# Patient Record
Sex: Female | Born: 1990 | Race: Black or African American | Hispanic: No | Marital: Single | Smoking: Never smoker
Health system: Southern US, Community
[De-identification: ages and names within clinical notes are randomized; demographics above are authoritative.]

## PROBLEM LIST (undated history)

## (undated) DIAGNOSIS — T7840XA Allergy, unspecified, initial encounter: Secondary | ICD-10-CM

## (undated) HISTORY — DX: Allergy, unspecified, initial encounter: T78.40XA

---

## 2012-08-21 ENCOUNTER — Ambulatory Visit: Payer: Self-pay | Admitting: Emergency Medicine

## 2012-08-21 VITALS — BP 122/88 | HR 104 | Temp 100.0°F | Resp 16 | Ht 62.38 in | Wt 163.6 lb

## 2012-08-21 DIAGNOSIS — J209 Acute bronchitis, unspecified: Secondary | ICD-10-CM

## 2012-08-21 DIAGNOSIS — R509 Fever, unspecified: Secondary | ICD-10-CM

## 2012-08-21 DIAGNOSIS — R52 Pain, unspecified: Secondary | ICD-10-CM

## 2012-08-21 LAB — POCT INFLUENZA A/B
Influenza A, POC: NEGATIVE
Influenza B, POC: NEGATIVE

## 2012-08-21 MED ORDER — HYDROCOD POLST-CHLORPHEN POLST 10-8 MG/5ML PO LQCR: 5.0000 mL | Freq: Two times a day (BID) | ORAL | Status: AC | PRN

## 2012-08-21 MED ORDER — AZITHROMYCIN 250 MG PO TABS: ORAL_TABLET | ORAL | Status: AC

## 2012-08-21 NOTE — Progress Notes (Signed)
[facility] [address] [place] [address] [phone]  Date:  08/21/2012   Name:  Anne Brooks   DOB:  04/02/1991   MRN:  [id number]  PCP:  No primary provider on file.    Chief Complaint: Generalized Body Aches, Fever, Chills and Cough   History of Present Illness:  Anne Brooks is a 21 y.o. very pleasant female patient who presents with the following:  Ill since Tuesday with fever, chills, myalgias and arthralgias.  Has clear nasal drainage and congestion and a cough that is largely non productive.  No wheezing or shortness of breath. No nausea or vomiting.  No stool change or rash.    Myalgias and arthralgias  There is no problem list on file for this patient.   Past Medical History  Diagnosis Date  . Allergy     History reviewed. No pertinent past surgical history.  History  Substance Use Topics  . Smoking status: Never Smoker   . Smokeless tobacco: Not on file  . Alcohol Use: No    Family History  Problem Relation Age of Onset  . Sickle cell trait Sister   . Cancer Maternal Grandfather   . Diabetes Maternal Grandfather   . Cancer Paternal Grandmother     No Known Allergies  Medication list has been reviewed and updated.  No current outpatient prescriptions on file prior to visit.    Review of Systems:  As per HPI, otherwise negative.    Physical Examination: Filed Vitals:   08/21/12 1502  BP: 122/88  Pulse: 104  Temp: 100 F (37.8 C)  Resp: 16   Filed Vitals:   08/21/12 1502  Height: 5' 2.38" (1.584 m)  Weight: 163 lb 9.6 oz (74.208 kg)   Body mass index is 29.56 kg/(m^2). Ideal Body Weight: Weight in (lb) to have BMI = 25: 138.1   GEN: WDWN, NAD, Non-toxic, A & O x 3  No rash, sepsis or shortness of breath.  Well hydrated HEENT: Atraumatic, Normocephalic. Neck supple. No masses, No LAD.  Oropharynx negative Ears and Nose: No external deformity.  TM negative CV: RRR, No M/G/R. No JVD. No thrill. No  extra heart sounds. PULM: CTA B, no wheezes, crackles, rhonchi. No retractions. No resp. distress. No accessory muscle use. ABD: S, NT, ND, +BS. No rebound. No HSM. EXTR: No c/c/e NEURO Normal gait.  PSYCH: Normally interactive. Conversant. Not depressed or anxious appearing.  Calm demeanor.     Assessment and Plan: Cough Flu AB zpak tussionex Follow up as needed  Carmelina Dane, MD   Results for orders placed in visit on 08/21/12  POCT INFLUENZA A/B      Component Value Range   Influenza A, POC Negative     Influenza B, POC Negative

## 2015-08-12 ENCOUNTER — Emergency Department (INDEPENDENT_AMBULATORY_CARE_PROVIDER_SITE_OTHER)
Admission: EM | Admit: 2015-08-12 | Discharge: 2015-08-12 | Disposition: A | Discharge status: Home / Self Care | Payer: Self-pay | Source: Home / Self Care

## 2015-08-12 ENCOUNTER — Encounter (HOSPITAL_COMMUNITY): Payer: Self-pay | Admitting: Emergency Medicine

## 2015-08-12 DIAGNOSIS — L03116 Cellulitis of left lower limb: Secondary | ICD-10-CM

## 2015-08-12 MED ORDER — AMOXICILLIN 875 MG PO TABS: 875.0000 mg | ORAL_TABLET | Freq: Two times a day (BID) | ORAL | Status: AC

## 2015-08-12 NOTE — ED Provider Notes (Signed)
CSN: [id number]     Arrival date & time 08/12/15  1555 History   None    Chief Complaint  Patient presents with  . Insect Bite   (Consider location/radiation/quality/duration/timing/severity/associated sxs/prior Treatment) HPI Comments: x3 days; non-prutitic.  Denies nausea vomiting diarrhea or fever.   The history is provided by the patient.    Past Medical History  Diagnosis Date  . Allergy    History reviewed. No pertinent past surgical history. Family History  Problem Relation Age of Onset  . Sickle cell trait Sister   . Cancer Maternal Grandfather   . Diabetes Maternal Grandfather   . Cancer Paternal Grandmother    Social History  Substance Use Topics  . Smoking status: Never Smoker   . Smokeless tobacco: None  . Alcohol Use: No   OB History    No data available     Review of Systems  Constitutional: Negative for fever and chills.  Respiratory: Negative for shortness of breath.   Cardiovascular: Negative for chest pain and leg swelling (only in area of cellulitis).  Musculoskeletal: Negative for joint swelling.  Skin: Positive for color change and wound (In proximity to area of cellulitis).  All other systems reviewed and are negative.   Allergies  Review of patient's allergies indicates no known allergies.  Home Medications   Prior to Admission medications   Medication Sig Start Date End Date Taking? Authorizing Provider  amoxicillin (AMOXIL) 875 MG tablet Take 1 tablet (875 mg total) by mouth 2 (two) times daily. 08/12/15   Arnaldo NatalMichael S Reesa Gotschall, MD  azithromycin (ZITHROMAX) 250 MG tablet Take 2 tabs PO x 1 dose, then 1 tab PO QD x 4 days 08/21/12   Carmelina DaneJeffery S Anderson, MD  chlorpheniramine-HYDROcodone [facility](TUSSIONEX PENNKINETIC ER) 10-8 MG/5ML LQCR Take 5 mLs by mouth every 12 (twelve) hours as needed (cough). 08/21/12   Carmelina DaneJeffery S Anderson, MD   Meds Ordered and Administered this Visit  Medications - No data to display  BP 133/79 mmHg  Pulse 86  Temp(Src)  98.7 F (37.1 C) (Oral)  Resp 16  SpO2 98%  LMP 07/29/2015 No data found.   Physical Exam  Constitutional: She is oriented to person, place, and time. She appears well-developed and well-nourished. No distress.  HENT:  Head: Normocephalic and atraumatic.  Mouth/Throat: Oropharynx is clear and moist.  Eyes: Conjunctivae and EOM are normal. Pupils are equal, round, and reactive to light. No scleral icterus.  Neck: Normal range of motion. Neck supple. No JVD present. No tracheal deviation present. No thyromegaly present.  Cardiovascular: Normal rate, regular rhythm and normal heart sounds.  Exam reveals no gallop and no friction rub.   No murmur heard. Pulmonary/Chest: Effort normal and breath sounds normal.  Abdominal: Soft. Bowel sounds are normal. She exhibits no distension. There is no tenderness.  Genitourinary:  Deferred  Musculoskeletal: Normal range of motion. She exhibits tenderness (Area of cellulitis left lower extremity). She exhibits no edema.  Lymphadenopathy:    She has no cervical adenopathy.  Neurological: She is alert and oriented to person, place, and time. No cranial nerve deficit.  Skin: Skin is warm and dry. There is erythema.     Psychiatric: She has a normal mood and affect. Her behavior is normal. Judgment and thought content normal.    ED Course  Procedures (including critical care time)  Labs Review Labs Reviewed - No data to display  Imaging Review No results found.   Visual Acuity Review  Right Eye Distance:  Left Eye Distance:   Bilateral Distance:    Right Eye Near:   Left Eye Near:    Bilateral Near:         MDM   1. Cellulitis of left lower extremity    Official cellulitis localized to lower extremity. Adjacent area of healing burn. No signs or symptoms of sepsis. Advised warm compresses. Take antibiotics to completion.    Arnaldo Natal, MD 08/12/15 1754

## 2015-08-12 NOTE — ED Notes (Signed)
Pt reports poss insect bite to LLE onset 11/9 Sx include: swelling, redness, tenderness and localized fever A&O x4... No acute distress.

## 2015-08-12 NOTE — Discharge Instructions (Signed)

## 2016-11-26 ENCOUNTER — Emergency Department (HOSPITAL_COMMUNITY)
Admission: EM | Admit: 2016-11-26 | Discharge: 2016-11-26 | Disposition: A | Discharge status: Home / Self Care | Payer: Self-pay | Attending: Emergency Medicine | Admitting: Emergency Medicine

## 2016-11-26 ENCOUNTER — Emergency Department (HOSPITAL_COMMUNITY): Payer: Self-pay

## 2016-11-26 ENCOUNTER — Encounter (HOSPITAL_COMMUNITY): Payer: Self-pay | Admitting: *Deleted

## 2016-11-26 DIAGNOSIS — Y9389 Activity, other specified: Secondary | ICD-10-CM | POA: Insufficient documentation

## 2016-11-26 DIAGNOSIS — Y999 Unspecified external cause status: Secondary | ICD-10-CM | POA: Insufficient documentation

## 2016-11-26 DIAGNOSIS — Y929 Unspecified place or not applicable: Secondary | ICD-10-CM | POA: Insufficient documentation

## 2016-11-26 DIAGNOSIS — S93401A Sprain of unspecified ligament of right ankle, initial encounter: Secondary | ICD-10-CM | POA: Insufficient documentation

## 2016-11-26 DIAGNOSIS — X501XXA Overexertion from prolonged static or awkward postures, initial encounter: Secondary | ICD-10-CM | POA: Insufficient documentation

## 2016-11-26 MED ORDER — IBUPROFEN 600 MG PO TABS: 600.0000 mg | ORAL_TABLET | Freq: Four times a day (QID) | ORAL | 0 refills | Status: AC | PRN

## 2016-11-26 NOTE — ED Provider Notes (Signed)
[facility]-EMERGENCY DEPT Provider Note   CSN: [id number] Arrival date & time: 11/26/16  1257  By signing my name below, I, Cynda AcresHailei Fulton, attest that this documentation has been prepared under the direction and in the presence of Melburn HakeNicole Nadeau, [place].  Electronically Signed: Cynda AcresHailei Fulton, Scribe. 11/26/16. 2:21 PM.  History   Chief Complaint Chief Complaint  Patient presents with  . Ankle Pain    right    HPI Comments: Anne Brooks is a 26 y.o. female with no apparent medical history, who presents to the Emergency Department complaining of sudden-onset, constant right ankle pain that began 3 days ago. Patient reports going [place] [employer] [place] days ago, in which she stepped wrong and her ankle " buckled" to the right side on uneven grounds. Patient was wearing high heels. Patient has associated worsening bruising and swelling. Patient has been wearing her boyfriend's brace and taking aleve with no relief. Pain is worse with ambulation. Patient is ambulatory in the emergency department. Patient denies any numbness, tingling, or any other symptoms. Denies prior injuries to right ankle.  The history is provided by the patient. No language interpreter was used.    Past Medical History:  Diagnosis Date  . Allergy     There are no active problems to display for this patient.   History reviewed. No pertinent surgical history.  OB History    No data available       Home Medications    Prior to Admission medications   Medication Sig Start Date End Date Taking? Authorizing Provider  amoxicillin (AMOXIL) 875 MG tablet Take 1 tablet (875 mg total) by mouth 2 (two) times daily. 08/12/15   Arnaldo NatalMichael S Diamond, MD  azithromycin (ZITHROMAX) 250 MG tablet Take 2 tabs PO x 1 dose, then 1 tab PO QD x 4 days 08/21/12   Carmelina DaneJeffery S Anderson, MD  chlorpheniramine-HYDROcodone [facility](TUSSIONEX PENNKINETIC ER) 10-8 MG/5ML LQCR Take 5 mLs by mouth every 12 (twelve) hours as needed (cough). 08/21/12    Carmelina DaneJeffery S Anderson, MD  ibuprofen (ADVIL,MOTRIN) 600 MG tablet Take 1 tablet (600 mg total) by mouth every 6 (six) hours as needed. 11/26/16   Barrett HenleNicole Elizabeth Nadeau, PA-C    Family History Family History  Problem Relation Age of Onset  . Sickle cell trait Sister   . Cancer Maternal Grandfather   . Diabetes Maternal Grandfather   . Cancer Paternal Grandmother     Social History Social History  Substance Use Topics  . Smoking status: Never Smoker  . Smokeless tobacco: Never Used  . Alcohol use No     Allergies   Patient has no known allergies.   Review of Systems Review of Systems  Constitutional: Negative for fever.  Musculoskeletal: Positive for arthralgias (right ankle).  Skin: Negative for wound.  Neurological: Negative for weakness and numbness.     Physical Exam Updated Vital Signs BP 130/96   Pulse 87   Temp 98.7 F (37.1 C) (Oral)   Resp 16   LMP 11/06/2016 (Exact Date)   SpO2 97%   Physical Exam  Constitutional: She is oriented to person, place, and time. She appears well-developed and well-nourished.  HENT:  Head: Normocephalic and atraumatic.  Eyes: Conjunctivae and EOM are normal. Right eye exhibits no discharge. Left eye exhibits no discharge. No scleral icterus.  Neck: Normal range of motion. Neck supple.  Cardiovascular: Normal rate and intact distal pulses.   Pulmonary/Chest: Effort normal.  Musculoskeletal: Normal range of motion. She exhibits tenderness. She  exhibits no edema or deformity.  Mild tenderness, swelling and ecchymosis noted to the right lateral malleolus extending to the right lateral mid forefoot. Full range of motion of the right toes, foot, ankle, and knee with 5/5 strength. Patient is able to ambulate, but reports pain. 2+ DP pulse. Senstaion grossly intact. Cap refill <2.   Neurological: She is alert and oriented to person, place, and time.  Skin: Skin is warm and dry. Capillary refill takes less than 2 seconds.  Nursing note  and vitals reviewed.    ED Treatments / Results  DIAGNOSTIC STUDIES: Oxygen Saturation is 97% on RA, normal by my interpretation.    COORDINATION OF CARE: 2:21 PM Discussed treatment plan with pt at bedside and pt agreed to plan, which includes a brace and anti-inflammatory pain medication.   Labs (all labs ordered are listed, but only abnormal results are displayed) Labs Reviewed - No data to display  EKG  EKG Interpretation None       Radiology Dg Ankle Complete Right  Result Date: 11/26/2016 CLINICAL DATA:  Ankle pain medially and laterally following twisting injury 2 days ago. Initial encounter. EXAM: RIGHT ANKLE - COMPLETE 3+ VIEW COMPARISON:  None. FINDINGS: The mineralization and alignment are normal. There is no evidence of acute fracture or dislocation. The joint spaces are maintained. There is mild lateral soft tissue swelling. IMPRESSION: No acute osseous findings. Electronically Signed   By: Carey Bullocks M.D.   On: 11/26/2016 14:01    Procedures Procedures (including critical care time)  Medications Ordered in ED Medications - No data to display   Initial Impression / Assessment and Plan / ED Course  I have reviewed the triage vital signs and the nursing notes.  Pertinent labs & imaging results that were available during my care of the patient were reviewed by me and considered in my medical decision making (see chart for details).     Patient X-Ray negative for obvious fracture or dislocation. Pain managed in ED. Pt advised to follow up with orthopedics if symptoms persist for possibility of missed fracture diagnosis. Patient given brace while in ED, conservative therapy recommended and discussed. Patient will be dc home & is agreeable with above plan.   Final Clinical Impressions(s) / ED Diagnoses   Final diagnoses:  Sprain of right ankle, unspecified ligament, initial encounter    New Prescriptions New Prescriptions   IBUPROFEN (ADVIL,MOTRIN) 600  MG TABLET    Take 1 tablet (600 mg total) by mouth every 6 (six) hours as needed.   I personally performed the services described in this documentation, which was scribed in my presence. The recorded information has been reviewed and is accurate.     Satira Sark Carlin, [place] 11/26/16 1423    Lorre Nick, MD 11/26/16 [phone]

## 2016-11-26 NOTE — ED Triage Notes (Signed)
Pt was [place] party on Saturday when she stepped and her right ankle rolled to the side.  Pt is ambulatory. Pt had a brace on her ankle.  Swelling noted to ankle.  Pt as full ROM.

## 2016-11-26 NOTE — Discharge Instructions (Signed)
Take your medication as prescribed. I also recommend continuing to rest, elevated and ice you ankle for 15-20 minutes, 3-4 times daily to help with your pain and swelling.  Follow up with the orthopedic office listed below if your symptoms have not improved or have worsened in the next week. Please return to the Emergency Department if symptoms worsen or new onset of fever, redness, swelling, numbness, tingling, weakness.

## 2018-02-25 IMAGING — CR DG ANKLE COMPLETE 3+V*R*
3 series · 3 of 3 positions shown · non-contrast
Comparison: None.

CLINICAL DATA: Ankle pain medially and laterally following twisting
injury 2 days ago. Initial encounter.

EXAM:
RIGHT ANKLE - COMPLETE 3+ VIEW

[x ankle ap right]
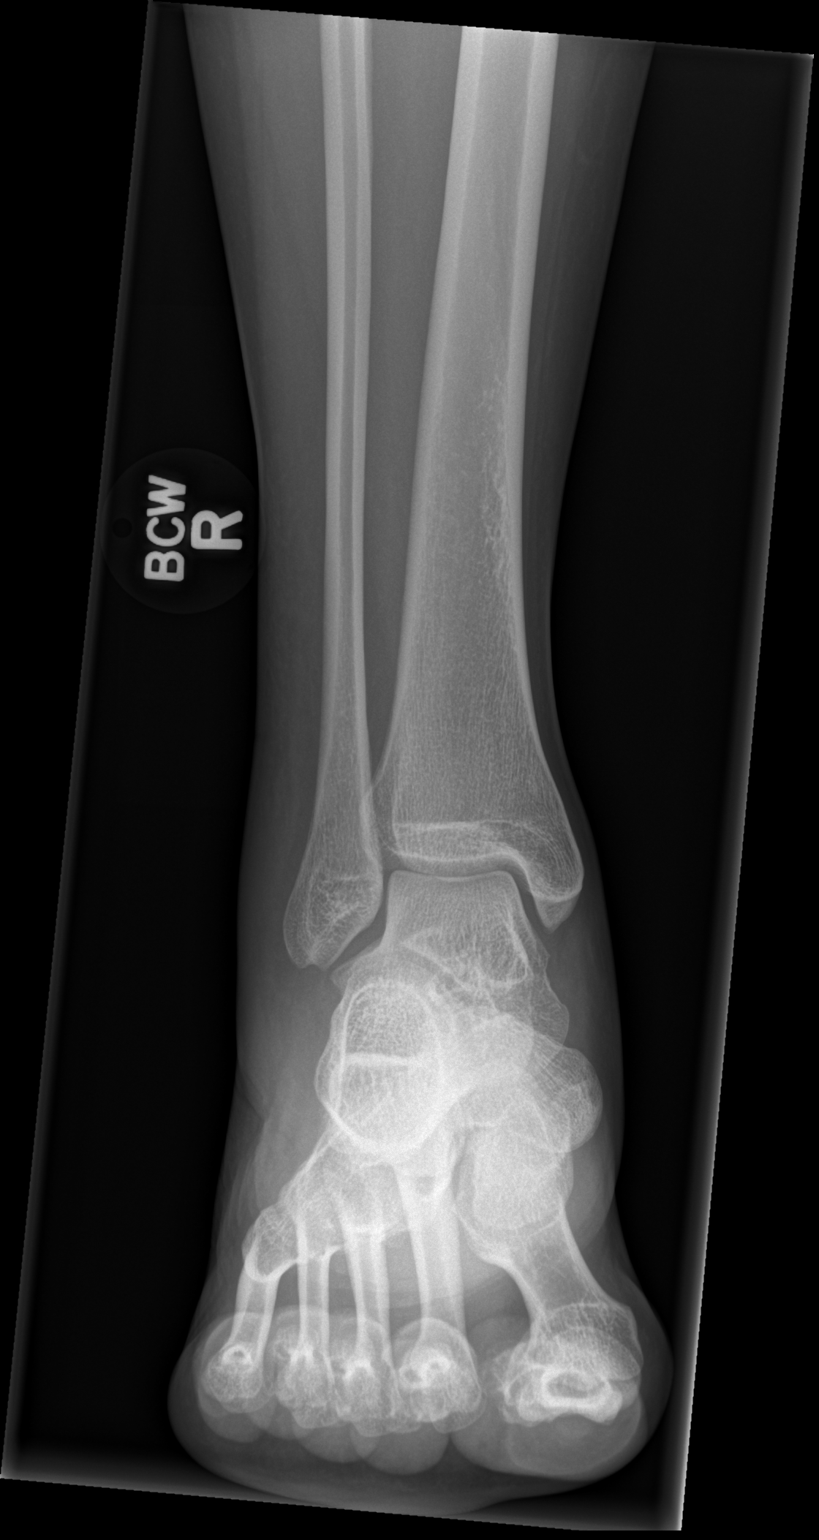

[x ankle obl right]
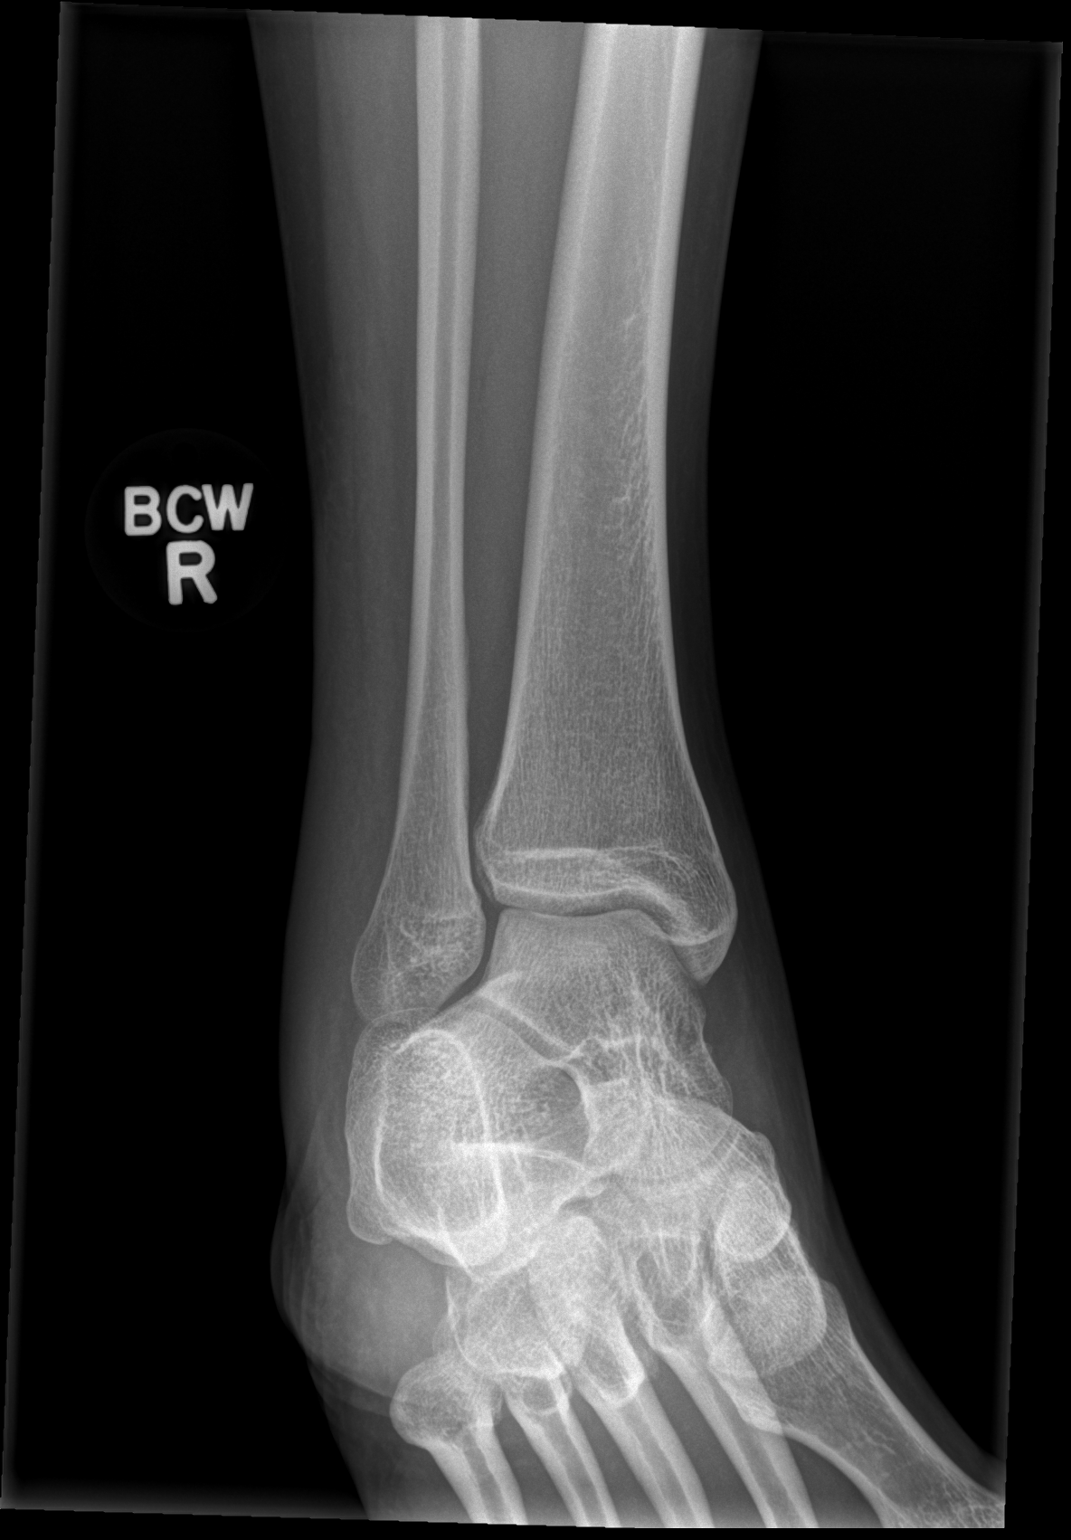

[x ankle lat right]
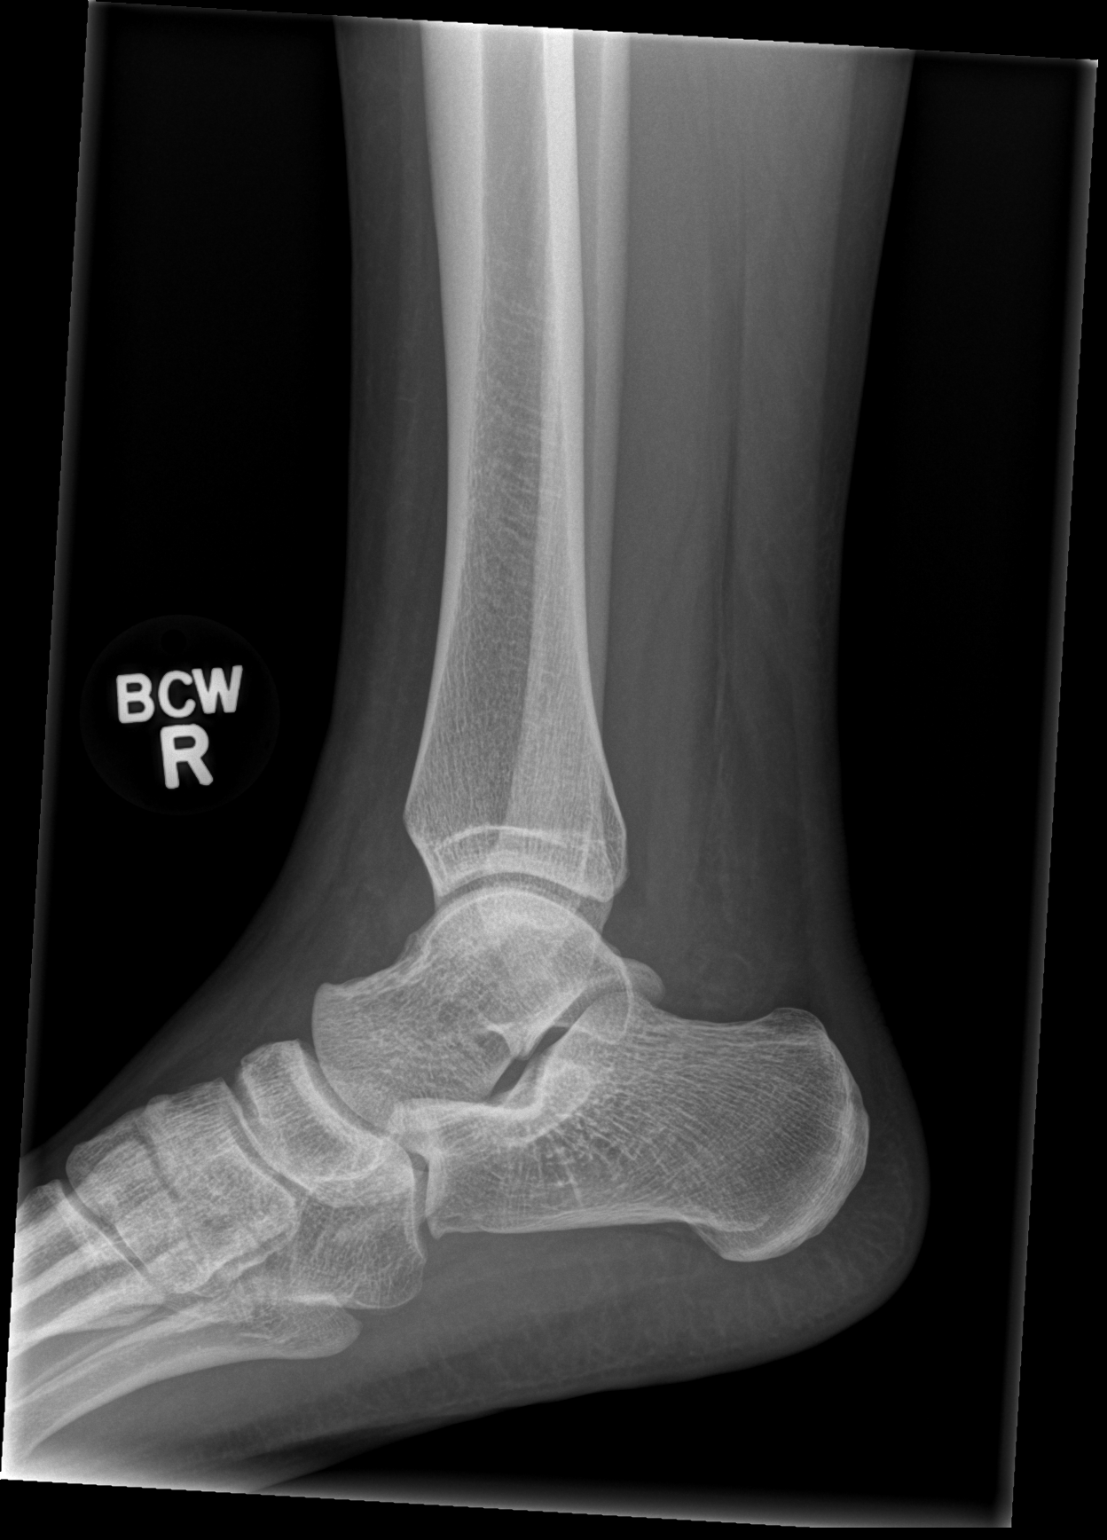

[3 of 3 positions shown; findings below may reference images not displayed]

FINDINGS: The mineralization and alignment are normal. There is no evidence of
acute fracture or dislocation. The joint spaces are maintained.
There is mild lateral soft tissue swelling.
IMPRESSION: No acute osseous findings.

## 2019-06-26 ENCOUNTER — Other Ambulatory Visit: Payer: Self-pay

## 2019-06-26 ENCOUNTER — Encounter (HOSPITAL_COMMUNITY): Payer: Self-pay | Admitting: Emergency Medicine

## 2019-06-26 ENCOUNTER — Emergency Department (HOSPITAL_COMMUNITY)
Admission: EM | Admit: 2019-06-26 | Discharge: 2019-06-26 | Disposition: A | Discharge status: Home / Self Care | Payer: Self-pay | Attending: Emergency Medicine | Admitting: Emergency Medicine

## 2019-06-26 DIAGNOSIS — K029 Dental caries, unspecified: Secondary | ICD-10-CM | POA: Insufficient documentation

## 2019-06-26 DIAGNOSIS — K0889 Other specified disorders of teeth and supporting structures: Secondary | ICD-10-CM

## 2019-06-26 DIAGNOSIS — Z79899 Other long term (current) drug therapy: Secondary | ICD-10-CM | POA: Insufficient documentation

## 2019-06-26 DIAGNOSIS — R220 Localized swelling, mass and lump, head: Secondary | ICD-10-CM | POA: Insufficient documentation

## 2019-06-26 MED ORDER — NAPROXEN 500 MG PO TABS: 500.0000 mg | ORAL_TABLET | Freq: Two times a day (BID) | ORAL | 0 refills | Status: AC | PRN

## 2019-06-26 MED ORDER — OXYCODONE-ACETAMINOPHEN 5-325 MG PO TABS: 1.0000 | ORAL_TABLET | Freq: Four times a day (QID) | ORAL | 0 refills | Status: AC | PRN

## 2019-06-26 MED ORDER — CLINDAMYCIN HCL 150 MG PO CAPS: 450.0000 mg | ORAL_CAPSULE | Freq: Three times a day (TID) | ORAL | 0 refills | Status: AC

## 2019-06-26 MED ORDER — CHLORHEXIDINE GLUCONATE 0.12 % MT SOLN: 15.0000 mL | Freq: Two times a day (BID) | OROMUCOSAL | 0 refills | Status: AC

## 2019-06-26 NOTE — ED Provider Notes (Signed)
[facility] DEPT Provider Note   CSN: [id number] Arrival date & time: 06/26/19  1331     History   Chief Complaint Chief Complaint  Patient presents with   Facial Swelling    HPI Anne Brooks is a 28 y.o. female otherwise healthy presents to emergency department today with chief complaint of dental pain x 5 days. She has known broken right bottom molar that hasn't been giving her problems until recently. She describes constant aching pain localized to right lower jaw. Rates pain 6/10. She has a dentist appointment scheduled for Monday, (4 days from today). She has tried taking tylenol and ibuprofen without symptom relief. When she woke up yesterday she noticed the swelling. She is tolerating PO intake.  Denies fever, chills, voice change, inability to control secretions, nausea/vomiting, dysphagia, odynophagia, drainage or trauma   Past Medical History:  Diagnosis Date   Allergy     There are no active problems to display for this patient.   History reviewed. No pertinent surgical history.   OB History   No obstetric history on file.      Home Medications    Prior to Admission medications   Medication Sig Start Date End Date Taking? Authorizing Provider  amoxicillin (AMOXIL) 875 MG tablet Take 1 tablet (875 mg total) by mouth 2 (two) times daily. 08/12/15   Harrie Foreman, MD  azithromycin (ZITHROMAX) 250 MG tablet Take 2 tabs PO x 1 dose, then 1 tab PO QD x 4 days 08/21/12   Roselee Culver, MD  chlorpheniramine-HYDROcodone [facility] PENNKINETIC ER) 10-8 MG/5ML [facility] Take 5 mLs by mouth every 12 (twelve) hours as needed (cough). 08/21/12   Roselee Culver, MD  ibuprofen (ADVIL,MOTRIN) 600 MG tablet Take 1 tablet (600 mg total) by mouth every 6 (six) hours as needed. 11/26/16   Nona Dell, PA-C    Family History Family History  Problem Relation Age of Onset   Sickle cell trait Sister    Cancer Maternal  Grandfather    Diabetes Maternal Grandfather    Cancer Paternal Grandmother     Social History Social History   Tobacco Use   Smoking status: Never Smoker   Smokeless tobacco: Never Used  Substance Use Topics   Alcohol use: No   Drug use: No     Allergies   Patient has no known allergies.   Review of Systems Review of Systems  Constitutional: Negative for chills and fever.  HENT: Positive for dental problem and facial swelling. Negative for sore throat, trouble swallowing and voice change.   Respiratory: Negative for cough.   Cardiovascular: Negative for chest pain.  Gastrointestinal: Negative for nausea and vomiting.  Musculoskeletal: Negative for neck pain.  Skin: Negative for wound.  Allergic/Immunologic: Negative for immunocompromised state.     Physical Exam Updated Vital Signs BP (!) 136/94    Pulse 77    Temp 99.1 F (37.3 C) (Oral)    Resp 17    LMP 06/19/2019   Physical Exam Physical Exam  Constitutional: Pt appears well-developed and well-nourished.  HENT:  Head: Normocephalic.  Right Ear: Tympanic membrane, external ear and ear canal normal.  Left Ear: Tympanic membrane, external ear and ear canal normal.  Nose: Nose normal. Right sinus exhibits no maxillary sinus tenderness and no frontal sinus tenderness. Left sinus exhibits no maxillary sinus tenderness and no frontal sinus tenderness.  Mouth/Throat: Uvula is midline, oropharynx is clear and moist and mucous membranes are normal. No oral lesions.  Abnormal dentition. Multiple broken teeth and dental Caries present. No uvula swelling or lacerations. No oropharyngeal exudate, posterior oropharyngeal edema, posterior oropharyngeal erythema or tonsillar abscesses.  No gingival swelling, fluctuance or induration. No gross abscess. No signs of Ludwig's angina noted. No trismus. Eyes: Conjunctivae are normal. Pupils are equal, round, and reactive to light. Right eye exhibits no discharge. Left eye exhibits  no discharge.  Neck: Normal range of motion. Neck supple. No stridor. Handling secretions without difficulty. No nuchal rigidity. No cervical lymphadenopathy Cardiovascular: Normal rate, regular rhythm and normal heart sounds.   Pulmonary/Chest: Effort normal. No respiratory distress. Equal chest rise. Abdominal: Abdomen is soft and non tender. Pt exhibits no distension.  Lymphadenopathy: Pt has no cervical adenopathy.  Neurological: Pt is alert.  Skin: Skin is warm and dry.  Psychiatric: Pt has a normal mood and affect.  Nursing note and vitals reviewed.   ED Treatments / Results  Labs (all labs ordered are listed, but only abnormal results are displayed) Labs Reviewed - No data to display  EKG None  Radiology No results found.  Procedures Procedures (including critical care time)  Medications Ordered in ED Medications - No data to display   Initial Impression / Assessment and Plan / ED Course  I have reviewed the triage vital signs and the nursing notes.  Pertinent labs & imaging results that were available during my care of the patient were reviewed by me and considered in my medical decision making (see chart for details).  Pt is well appearing.  Chronic dental decay. Patient with toothache, mild facial swelling. No trismus. Tolerating oral secretions, air way intact.  No gross abscess.  Exam unconcerning for Ludwig's angina or spread of infection.  Will treat with clindamycin and anti-inflammatories medicine. PDMP reviewed during this encounter. Pt has not recent narcotic prescriptions. Will give short course for severe pain. Pt has scheduled dental appointment in 4 days, strongly recommend she keep that appointment for follow up.  VSS.  Pt appears stable for d/c   Final Clinical Impressions(s) / ED Diagnoses   Final diagnoses:  None    ED Discharge Orders    None       Kathyrn Lass 06/27/19 2332    Charlynne Pander, MD 06/28/19 [phone]

## 2019-06-26 NOTE — ED Triage Notes (Signed)
Pt c/o right dental pain with swelling for couple days.

## 2019-06-26 NOTE — Discharge Instructions (Addendum)
Today you were seen for dental pain.  °If you start to experience and new or worsening symptoms return to the emergency department. If you start to experience fever, chills, neck stiffness/pain, or inability to move your neck or open your mouth come back to the emergency department immediately. If you begin to experience any blistering, rashes, swelling, or difficulty breathing seek medical care for evaluation of potentially more serious side effects. ° °Medications:  °-I have prescribed you an antibiotic  clindamycin to treat the infection and Naproxen which is an anti-inflammatory medicine to treat the pain.  °-Swish and spit 15 mL of chlorhexidine mouthwash for 30 seconds times daily.  You can also gargle warm salt water up to 5 times daily.  Both of these rinses can help to remove bacteria from the mouth.   You can apply a cool compress for 15 to 20 minutes, but avoid applying heat to the area. °-Continue usual home medications. ° °2. Follow Up: Use the resource guide listed below to help you find a dentist if you do not already have one to follow up with. It is very important that you get evaluated by a dentist as soon as possible. Call tomorrow to schedule an appointment. Read the instructions below. ° °  ° °Be sure to eat something when taking the Naproxen as it can cause stomach upset and at worst stomach bleeding. Do not take additional non steroidal anti-inflammatory medicines such as Ibuprofen, Aleve, Advil, Mobic, Diclofenac, or goodie powder while taking Naproxen. You may supplement with Tylenol.  ° ° °Dental Care: °Organization         Address  Phone  Notes  °[facility] [address], [place] [phone] Accepts children up to age 21 who are enrolled in Medicaid or [place] [employer] Choice; pregnant women with a Medicaid card; and children who have applied for Medicaid or [place] [employer] Choice, but were declined, whose parents can pay a reduced  fee at time of service.  °[facility] [place]  [address], [place] [phone] Accepts children up to age 21 who are enrolled in Medicaid or [place] Health Choice; pregnant women with a Medicaid card; and children who have applied for Medicaid or [place] Health Choice, but were declined, whose parents can pay a reduced fee at time of service.  °[facility] ° [address], [place] [phone] Patients are seen by appointment only. Walk-ins are not accepted. [facility] will see patients 18 years of age and older. °Monday - Tuesday (8am-5pm) °Most Wednesdays (8:30-5pm) °$30 per visit, cash only  °[facility] ° [address], [place] [phone] Patients are seen by appointment only. Walk-ins are not accepted. [facility] will see patients 18 years of age and older. °One Wednesday Evening (Monthly: Volunteer Based).  $30 per visit, cash only  °[facility]  [phone] for adults; Children under age 4, call Graduate Pediatric Dentistry at [phone]. Children aged 4-14, please call [phone] to request a pediatric application. ° Dental services are provided in all areas of dental care including fillings, crowns and bridges, complete and partial dentures, implants, gum treatment, root canals, and extractions. Preventive care is also provided. Treatment is provided to both adults and children. °Patients are selected via a lottery and there is often a waiting list. °  °[facility] [address], °[place] ° [phone] [email or web address] °  °[facility] [address], [place], [place] [phone], Ext. [phone] Second and Fourth Thursday of each month, opens at 6:30 AM; Clinic ends at 9 AM.  Patients are seen on a first-come first-served basis, and a limited number are seen during each clinic.  ° °[facility] ° [address],  [place], [place] [phone]   Eligibility Requirements °You must have lived in [place], [place], or [place] counties for at least the last three months. °  You cannot be eligible for state or federal sponsored healthcare insurance, including Veterans Administration, Medicaid, or Medicare. °  You generally cannot be eligible for healthcare insurance through your employer.  °  How to apply: °Eligibility screenings are held every Tuesday and Wednesday afternoon from 1:00 pm until 4:00 pm. You do not need an appointment for the interview!  °[facility] [address], [place], [place] [phone]   °[facility]  [phone]   °[facility]  [phone]   °[facility]  [phone]   ° °RESOURCE GUIDE: ° °Dental Problems ° °Patients with Medicaid: °[facility]                     [facility] °[address]                                [address] °Phone:  [phone]                                                  Phone:  [phone] ° °If unable to pay or uninsured, contact:  [facility] or [facility] to become qualified for the adult dental clinic. ° °Insufficient Money for Medicine °Contact [employer]:  call "211" or [facility] 271-5999. ° °No Primary Care Doctor °Call [facility]  832-8000 °Other agencies that provide inexpensive medical care °   [facility]  832-8035 °   [facility]  832-7272 °   [facility]  271-5999 °   [facility]  832-4777 °   [facility]  373-0678 °   [facility]  [phone] ° °
